# Patient Record
Sex: Female | Born: 1980 | Race: Black or African American | Hispanic: No | Marital: Single | Smoking: Never smoker
Health system: Southern US, Community
[De-identification: ages and names within clinical notes are randomized; demographics above are authoritative.]

---

## 2015-11-18 ENCOUNTER — Emergency Department: Payer: BC Managed Care – PPO

## 2015-11-18 ENCOUNTER — Emergency Department
Admission: EM | Admit: 2015-11-18 | Discharge: 2015-11-18 | Disposition: A | Discharge status: Home / Self Care | Payer: BC Managed Care – PPO | Attending: Emergency Medicine | Admitting: Emergency Medicine

## 2015-11-18 ENCOUNTER — Encounter: Payer: Self-pay | Admitting: Emergency Medicine

## 2015-11-18 DIAGNOSIS — R569 Unspecified convulsions: Secondary | ICD-10-CM | POA: Insufficient documentation

## 2015-11-18 LAB — BASIC METABOLIC PANEL
Anion gap: 10 (ref 5–15)
BUN: 10 mg/dL (ref 6–20)
CHLORIDE: 102 mmol/L (ref 101–111)
CO2: 23 mmol/L (ref 22–32)
CREATININE: 0.68 mg/dL (ref 0.44–1.00)
Calcium: 8.9 mg/dL (ref 8.9–10.3)
GFR calc Af Amer: >60 mL/min (ref >60)
GFR calc non Af Amer: >60 mL/min (ref >60)
GLUCOSE: 96 mg/dL (ref 65–99)
Potassium: 3.9 mmol/L (ref 3.5–5.1)
Sodium: 135 mmol/L (ref 135–145)

## 2015-11-18 LAB — CBC
HCT: 35.8 % (ref 35.0–47.0)
Hemoglobin: 11.6 g/dL — ABNORMAL LOW (ref 12.0–16.0)
MCH: 26.5 pg (ref 26.0–34.0)
MCHC: 32.4 g/dL (ref 32.0–36.0)
MCV: 81.6 fL (ref 80.0–100.0)
PLATELETS: 329 10*3/uL (ref 150–440)
RBC: 4.38 MIL/uL (ref 3.80–5.20)
RDW: 17.3 % — AB (ref 11.5–14.5)
WBC: 6.8 10*3/uL (ref 3.6–11.0)

## 2015-11-18 LAB — POCT PREGNANCY, URINE: Preg Test, Ur: NEGATIVE

## 2015-11-18 LAB — MAGNESIUM: Magnesium: 1.9 mg/dL (ref 1.7–2.4)

## 2015-11-18 NOTE — ED Notes (Signed)
Pt to rm 5 via ems from work, ems report possible seizure.  EMS report pt stated she didn't feel well, had possible syncopal episode and coworkers report posturing.  First responders state pt was post-ictal initially, EMS states when they arrive pt A&O.  Pt denies pain and states feels pretty normal.

## 2015-11-18 NOTE — Discharge Instructions (Signed)
Seizure, Adult °A seizure is abnormal electrical activity in the brain. Seizures usually last from 30 seconds to 2 minutes. There are various types of seizures. °Before a seizure, you may have a warning sensation (aura) that a seizure is about to occur. An aura may include the following symptoms:  °· Fear or anxiety. °· Nausea. °· Feeling like the room is spinning (vertigo). °· Vision changes, such as seeing flashing lights or spots. °Common symptoms during a seizure include: °· A change in attention or behavior (altered mental status). °· Convulsions with rhythmic jerking movements. °· Drooling. °· Rapid eye movements. °· Grunting. °· Loss of bladder and bowel control. °· Bitter taste in the mouth. °· Tongue biting. °After a seizure, you may feel confused and sleepy. You may also have an injury resulting from convulsions during the seizure. °HOME CARE INSTRUCTIONS  °· If you are given medicines, take them exactly as prescribed by your health care provider. °· Keep all follow-up appointments as directed by your health care provider. °· Do not swim or drive or engage in risky activity during which a seizure could cause further injury to you or others until your health care provider says it is OK. °· Get adequate rest. °· Teach friends and family what to do if you have a seizure. They should: °¨ Lay you on the ground to prevent a fall. °¨ Put a cushion under your head. °¨ Loosen any tight clothing around your neck. °¨ Turn you on your side. If vomiting occurs, this helps keep your airway clear. °¨ Stay with you until you recover. °¨ Know whether or not you need emergency care. °SEEK IMMEDIATE MEDICAL CARE IF: °· The seizure lasts longer than 5 minutes. °· The seizure is severe or you do not wake up immediately after the seizure. °· You have an altered mental status after the seizure. °· You are having more frequent or worsening seizures. °Someone should drive you to the emergency department or call local emergency  services (911 in [place] °MAKE SURE YOU: °· Understand these instructions. °· Will watch your condition. °· Will get help right away if you are not doing well or get worse. °  °This information is not intended to replace advice given to you by your health care provider. Make sure you discuss any questions you have with your health care provider. °  °Document Released: 07/13/2000 Document Revised: 08/06/2014 Document Reviewed: 02/25/2013 °Elsevier Interactive Patient Education ©2016 [employer] ° °Please return immediately if condition worsens. Please contact her primary physician or the physician you were given for referral. If you have any specialist physicians involved in her treatment and plan please also contact them. Thank you for using [facility]. ° °

## 2015-11-18 NOTE — ED Provider Notes (Signed)
Time Seen: Approximately 1550  I have reviewed the triage notes  Chief Complaint: Seizures   History of Present Illness: Sandy Grimes is a 35 y.o. female who presents via EMS from her workplace after the patient had a possible seizure. Patient states she hasn't really felt well all day with periodic lightheadedness and near syncopal episodes. She also noticed a headache this morning frontal region. She states she's been nauseated without any vomiting. She denies any focal weakness. She states she's recently been changing her diet around with less carbohydrates, etc. Her somewhat the patient remembers was standing up and then the next thing she knows the first responders were there. Coworkers state that the patient had some brief questionable seizure activity with some posturing. EMS felt the patient was post ictal. She did bite her time. She describes urinating on herself. She states right now she feels fine at this point. Denies any focal weakness. She denies any neck thoracic or lumbar spine pain. She denies any chest or abdominal pain. Doesn't feel there is a risk that she's pregnant.   History reviewed. No pertinent past medical history.  There are no active problems to display for this patient.   History reviewed. No pertinent past surgical history.  History reviewed. No pertinent past surgical history.  No current outpatient prescriptions on file.  Allergies:  Review of patient's allergies indicates no known allergies.  Family History: History reviewed. No pertinent family history.  Social History: Social History  Substance Use Topics  . Smoking status: Never Smoker   . Smokeless tobacco: None  . Alcohol Use: No     Review of Systems:   10 point review of systems was performed and was otherwise negative:  Constitutional: No fever Eyes: No visual disturbances ENT: No sore throat, ear pain Cardiac: No chest pain Respiratory: No shortness of breath, wheezing,  or stridor Abdomen: No abdominal pain, no vomiting, No diarrhea Endocrine: No weight loss, No night sweats Extremities: Patient describes some peripheral edema almost on a daily basis. no new findings Skin: No rashes, easy bruising Neurologic: No focal weakness, trouble with speech or swollowing Urologic: No dysuria, Hematuria, or urinary frequency   Physical Exam:  ED Triage Vitals  Enc Vitals Group     BP 11/18/15 1551 146/71 mmHg     Pulse Rate 11/18/15 1551 130     Resp 11/18/15 1551 17     Temp --      Temp src --      SpO2 11/18/15 1551 100 %     Weight 11/18/15 1541 240 lb (108.863 kg)     Height 11/18/15 1541  (1.6 m)     Head Cir --      Peak Flow --      Pain Score 11/18/15 1543 0     Pain Loc --      Pain Edu? --      Excl. in GC? --     General: Awake , Alert , and Oriented times 3; GCS 15 Head: Normal cephalic , atraumatic Eyes: Pupils equal , round, reactive to light Nose/Throat: No nasal drainage, patent upper airway without erythema or exudate. Crush injury to the left side of the tongue  Neck: Supple, Full range of motion, No anterior adenopathy or palpable thyroid masses Lungs: Clear to ascultation without wheezes , rhonchi, or rales Heart: Tachycardic, regular rhythm without murmurs , gallops , or rubs Abdomen: Soft, non tender without rebound, guarding , or rigidity; bowel sounds positive  and symmetric in all 4 quadrants. No organomegaly .        Extremities: 2 plus symmetric pulses. No edema, clubbing or cyanosis Neurologic: normal ambulation, Motor symmetric without deficits, sensory intact Skin: warm, dry, no rashes   Labs:   All laboratory work was reviewed including any pertinent negatives or positives listed below:  Labs Reviewed  BASIC METABOLIC PANEL  CBC  MAGNESIUM  CBG MONITORING, ED  POC URINE PREG, ED    EKG:  ED ECG REPORT I, Jennye MoccasinBrian S Quigley, the attending physician, personally viewed and interpreted this ECG.  Date:  11/18/2015 EKG Time: 1552 Rate: 125 Rhythm: Sinus tachycardia QRS Axis: normal Intervals: normal ST/T Wave abnormalities: normal Conduction Disturbances: none Narrative Interpretation: unremarkable No acute ischemic changes   Radiology:  EXAM: CT HEAD WITHOUT CONTRAST  TECHNIQUE: Contiguous axial images were obtained from the base of the skull through the vertex without intravenous contrast.  COMPARISON: None  FINDINGS: Normal ventricular morphology.  No midline shift or mass effect.  Normal appearance of brain parenchyma.  No intracranial hemorrhage, mass lesion or evidence acute infarction.  No extra-axial fluid collections.  Visualized paranasal sinuses and mastoid air cells clear.  No acute osseous findings.  IMPRESSION: No acute intracranial abnormalities.      I personally reviewed the radiologic studies   ED Course: Patient's stay here was uneventful and she had no other further seizures or any other residual symptoms. Her heart rate was elevated when she arrived which I felt was due to the overall seizure presentation. Her seizures seemed to be generalized and brief in nature based on history She does not have any focal findings on her workup here in emergency department. Patient was given seizure instructions and she is part of the [facility] and states that [place]. She's been advised drink plenty of fluids and was referred to local neurology as needed.  Assessment:  New onset generalized seizure      Plan:  Outpatient management Patient was advised to return immediately if condition worsens. Patient was advised to follow up with their primary care physician or other specialized physicians involved in their outpatient care. The patient and/or family member/power of attorney had laboratory results reviewed at the bedside. All questions and concerns were addressed and appropriate discharge  instructions were distributed by the nursing staff.           Jennye MoccasinBrian S Quigley, MD 11/18/15 [id number]

## 2017-06-28 IMAGING — CT CT HEAD W/O CM
2 of 3 series · 13 of 30 positions shown, 15 images · non-contrast
Comparison: None

CLINICAL DATA: Seizure activity today, headache, patient cannot
recall event

EXAM:
CT HEAD WITHOUT CONTRAST
TECHNIQUE: Contiguous axial images were obtained from the base of the skull
through the vertex without intravenous contrast.

[Series 2: head wo · axial · 0.39mm/px · z∈[+508,+598]mm · 5 of 32 slices shown, 7 images]
[im 6/32  brain]
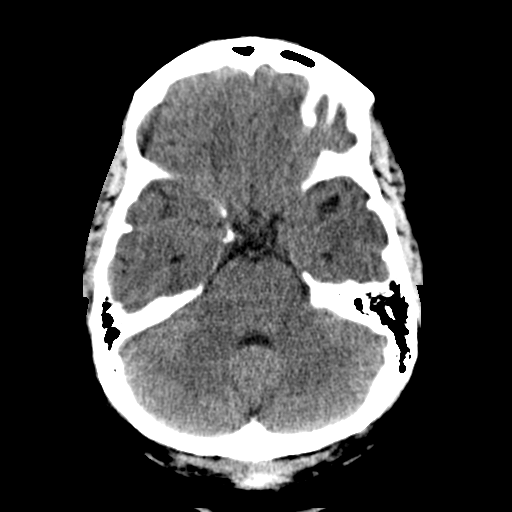
[im 6/32  bone]
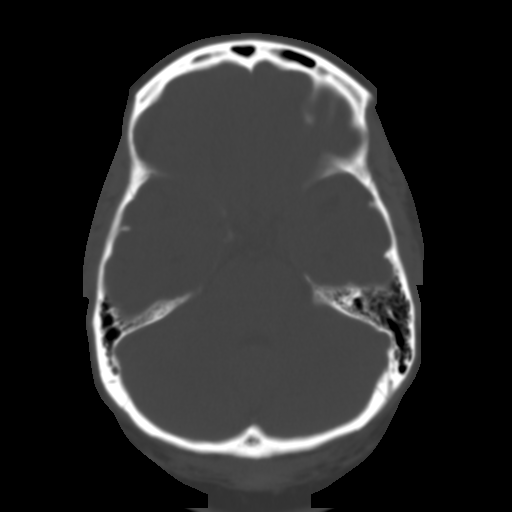
[im 11/32  brain]
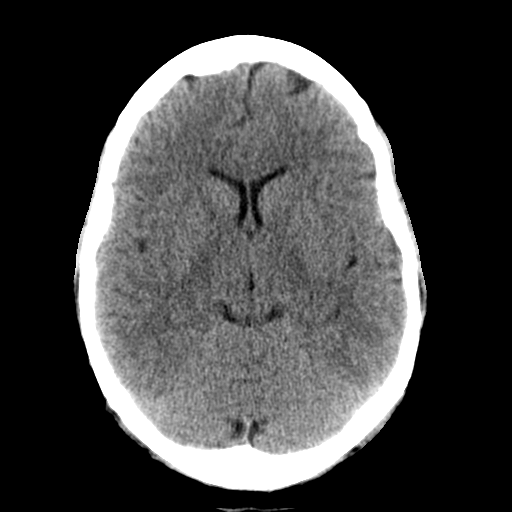
[im 16/32  brain]
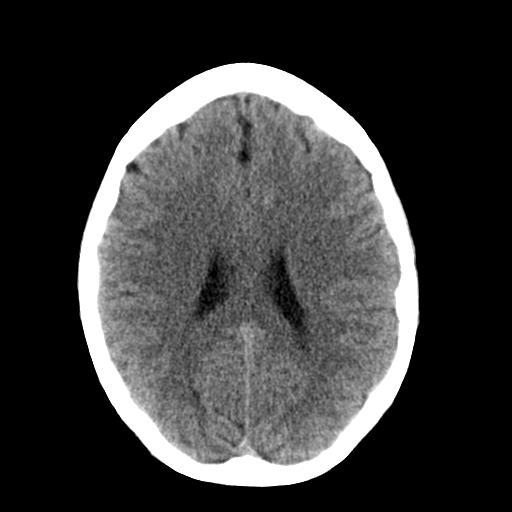
[im 21/32  brain]
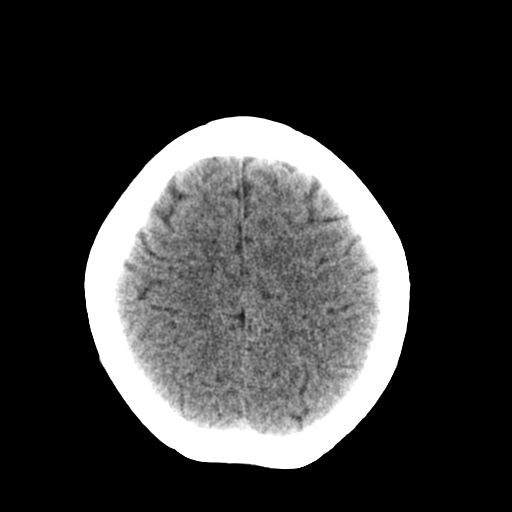
[im 26/32  brain]
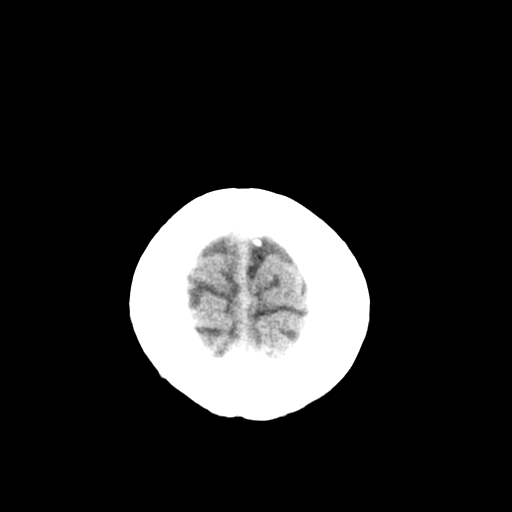
[im 26/32  bone]
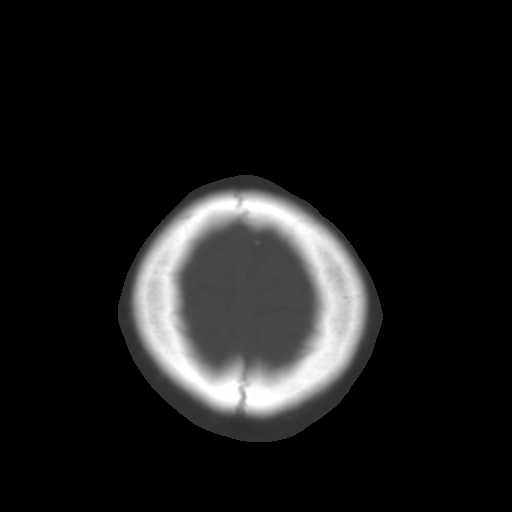

[Series 3: head bone · axial · 0.39mm/px · z∈[+498,+612]mm · 8 of 96 slices shown]
[im 10/96  bone]
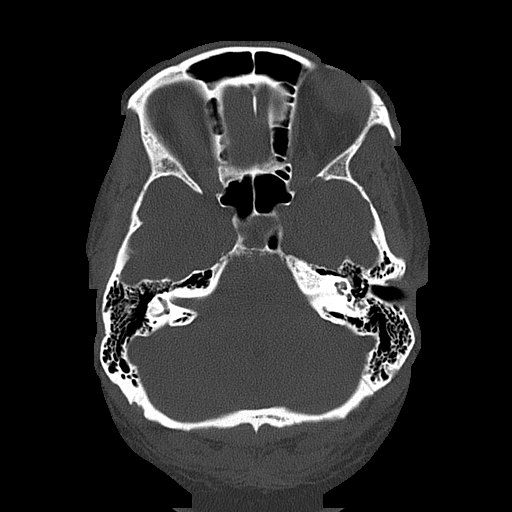
[im 20/96  bone]
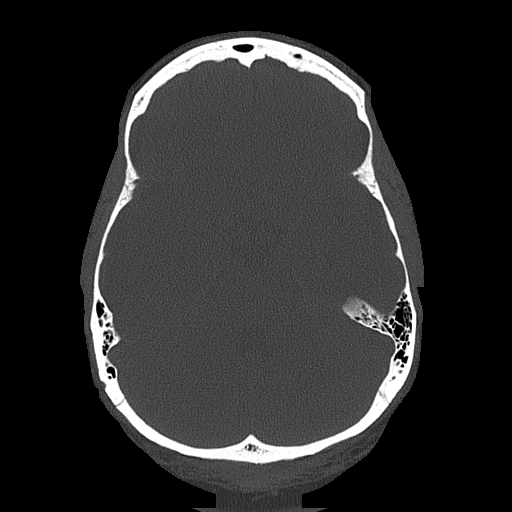
[im 29/96  bone]
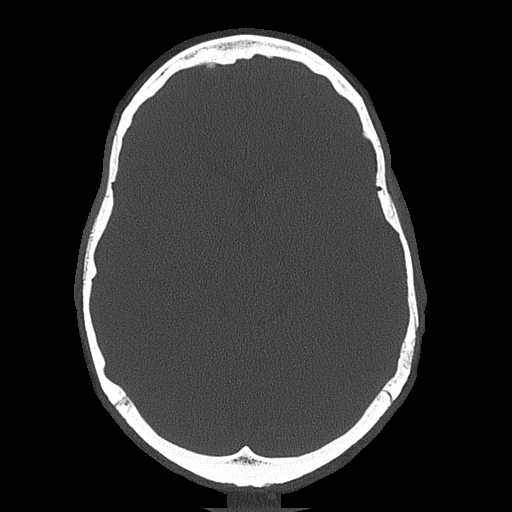
[im 43/96  bone]
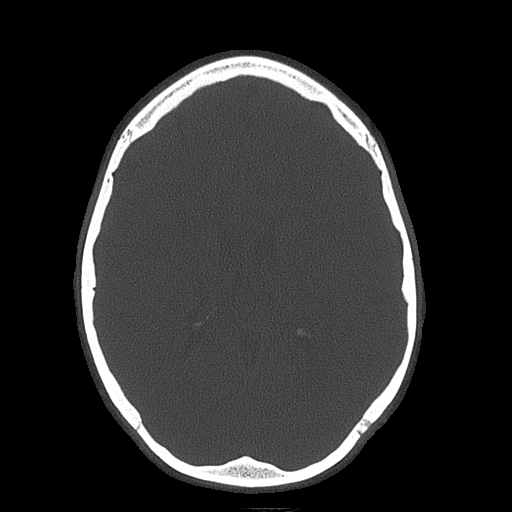
[im 53/96  bone]
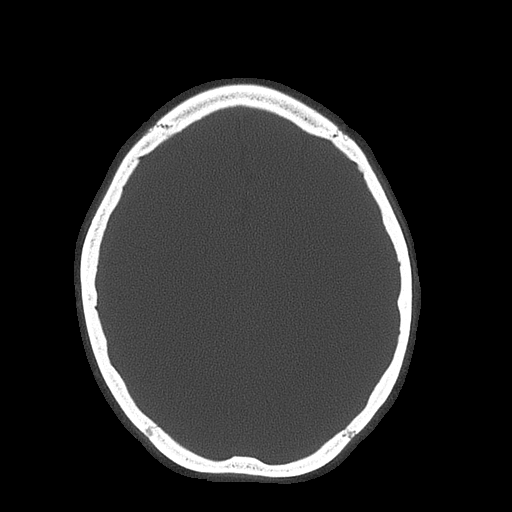
[im 67/96  bone]
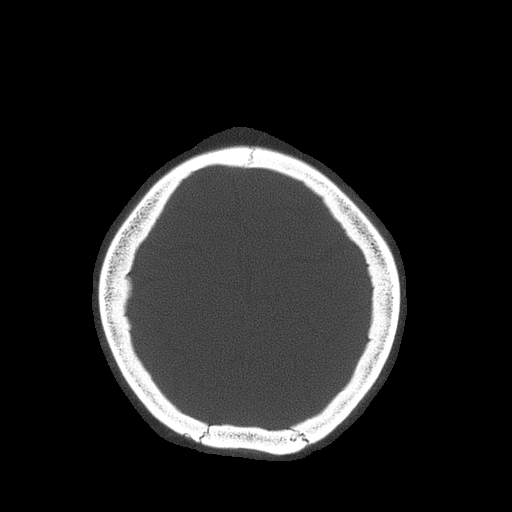
[im 77/96  bone]
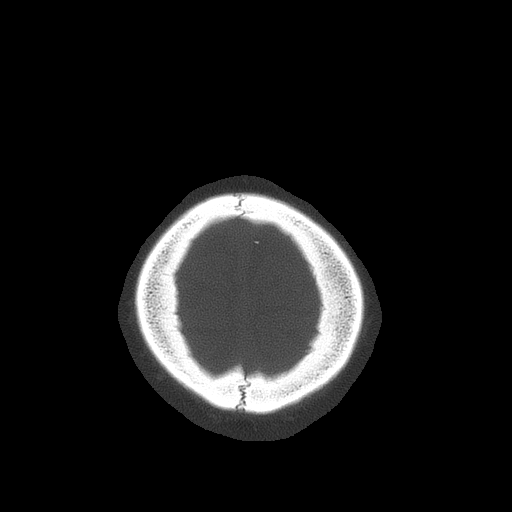
[im 86/96  bone]
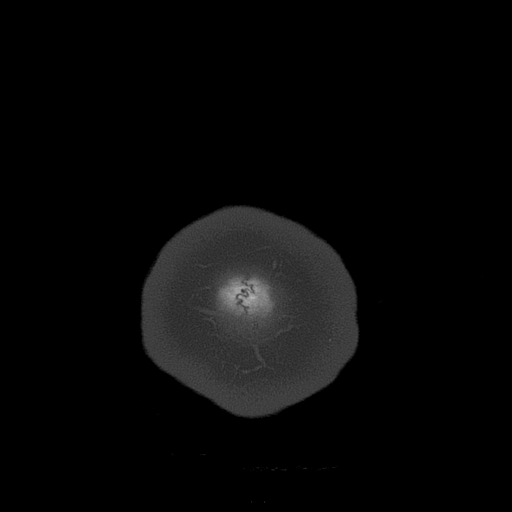

[13 of 30 positions shown; findings below may reference images not displayed]

FINDINGS: Normal ventricular morphology.

No midline shift or mass effect.

Normal appearance of brain parenchyma.

No intracranial hemorrhage, mass lesion or evidence acute
infarction.

No extra-axial fluid collections.

Visualized paranasal sinuses and mastoid air cells clear.

No acute osseous findings.
IMPRESSION: No acute intracranial abnormalities.
